# Patient Record
Sex: Female | Born: 1995
Health system: Southern US, Community
[De-identification: ages and names within clinical notes are randomized; demographics above are authoritative.]

## PROBLEM LIST (undated history)

## (undated) ENCOUNTER — Inpatient Hospital Stay (HOSPITAL_COMMUNITY): Payer: Self-pay

---
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
## 2015-09-04 ENCOUNTER — Encounter (HOSPITAL_COMMUNITY): Payer: Self-pay | Admitting: Nurse Practitioner

## 2015-09-04 ENCOUNTER — Emergency Department (HOSPITAL_COMMUNITY): Payer: Medicaid Other

## 2015-09-04 ENCOUNTER — Emergency Department (HOSPITAL_COMMUNITY)
Admission: EM | Admit: 2015-09-04 | Discharge: 2015-09-04 | Disposition: A | Discharge status: Home / Self Care | Payer: Medicaid Other | Attending: Emergency Medicine | Admitting: Emergency Medicine

## 2015-09-04 DIAGNOSIS — O210 Mild hyperemesis gravidarum: Secondary | ICD-10-CM | POA: Insufficient documentation

## 2015-09-04 DIAGNOSIS — Z3A10 10 weeks gestation of pregnancy: Secondary | ICD-10-CM | POA: Insufficient documentation

## 2015-09-04 DIAGNOSIS — Z792 Long term (current) use of antibiotics: Secondary | ICD-10-CM | POA: Diagnosis not present

## 2015-09-04 DIAGNOSIS — R1030 Lower abdominal pain, unspecified: Secondary | ICD-10-CM | POA: Insufficient documentation

## 2015-09-04 DIAGNOSIS — Z3490 Encounter for supervision of normal pregnancy, unspecified, unspecified trimester: Secondary | ICD-10-CM

## 2015-09-04 DIAGNOSIS — O9989 Other specified diseases and conditions complicating pregnancy, childbirth and the puerperium: Secondary | ICD-10-CM | POA: Diagnosis present

## 2015-09-04 LAB — URINALYSIS, ROUTINE W REFLEX MICROSCOPIC
Bilirubin Urine: NEGATIVE
GLUCOSE, UA: NEGATIVE mg/dL
Hgb urine dipstick: NEGATIVE
KETONES UR: 40 mg/dL — AB
LEUKOCYTES UA: NEGATIVE
NITRITE: NEGATIVE
PH: 7 (ref 5.0–8.0)
Protein, ur: NEGATIVE mg/dL
SPECIFIC GRAVITY, URINE: 1.023 (ref 1.005–1.030)

## 2015-09-04 LAB — COMPREHENSIVE METABOLIC PANEL
ALBUMIN: 4 g/dL (ref 3.5–5.0)
ALK PHOS: 45 U/L (ref 38–126)
ALT: 9 U/L — ABNORMAL LOW (ref 14–54)
ANION GAP: 10 (ref 5–15)
AST: 19 U/L (ref 15–41)
BILIRUBIN TOTAL: 0.5 mg/dL (ref 0.3–1.2)
BUN: 9 mg/dL (ref 6–20)
CALCIUM: 9.5 mg/dL (ref 8.9–10.3)
CO2: 24 mmol/L (ref 22–32)
Chloride: 101 mmol/L (ref 101–111)
Creatinine, Ser: 0.65 mg/dL (ref 0.44–1.00)
GFR calc non Af Amer: >60 mL/min (ref >60)
GLUCOSE: 86 mg/dL (ref 65–99)
POTASSIUM: 3.7 mmol/L (ref 3.5–5.1)
SODIUM: 135 mmol/L (ref 135–145)
TOTAL PROTEIN: 8.1 g/dL (ref 6.5–8.1)

## 2015-09-04 LAB — CBC
HEMATOCRIT: 39.8 % (ref 36.0–46.0)
HEMOGLOBIN: 13.3 g/dL (ref 12.0–15.0)
MCH: 29.8 pg (ref 26.0–34.0)
MCHC: 33.4 g/dL (ref 30.0–36.0)
MCV: 89.2 fL (ref 78.0–100.0)
Platelets: 259 10*3/uL (ref 150–400)
RBC: 4.46 MIL/uL (ref 3.87–5.11)
RDW: 13 % (ref 11.5–15.5)
WBC: 9.2 10*3/uL (ref 4.0–10.5)

## 2015-09-04 LAB — WET PREP, GENITAL
Clue Cells Wet Prep HPF POC: NONE SEEN
Sperm: NONE SEEN
Trich, Wet Prep: NONE SEEN
Yeast Wet Prep HPF POC: NONE SEEN

## 2015-09-04 LAB — I-STAT BETA HCG BLOOD, ED (MC, WL, AP ONLY): I-stat hCG, quantitative: >2000 m[IU]/mL — ABNORMAL HIGH (ref <5)

## 2015-09-04 LAB — HCG, QUANTITATIVE, PREGNANCY: HCG, BETA CHAIN, QUANT, S: 293145 m[IU]/mL — AB (ref <5)

## 2015-09-04 NOTE — Discharge Instructions (Signed)
First Trimester of Pregnancy The first trimester of pregnancy is from week 1 until the end of week 12 (months 1 through 3). A week after a sperm fertilizes an egg, the egg will implant on the wall of the uterus. This embryo will begin to develop into a baby. Genes from you and your partner are forming the baby. The female genes determine whether the baby is a boy or a girl. At 6-8 weeks, the eyes and face are formed, and the heartbeat can be seen on ultrasound. At the end of 12 weeks, all the baby's organs are formed.  Now that you are pregnant, you will want to do everything you can to have a healthy baby. Two of the most important things are to get good prenatal care and to follow your health care provider's instructions. Prenatal care is all the medical care you receive before the baby's birth. This care will help prevent, find, and treat any problems during the pregnancy and childbirth. BODY CHANGES Your body goes through many changes during pregnancy. The changes vary from woman to woman.   You may gain or lose a couple of pounds at first.  You may feel sick to your stomach (nauseous) and throw up (vomit). If the vomiting is uncontrollable, call your health care provider.  You may tire easily.  You may develop headaches that can be relieved by medicines approved by your health care provider.  You may urinate more often. Painful urination may mean you have a bladder infection.  You may develop heartburn as a result of your pregnancy.  You may develop constipation because certain hormones are causing the muscles that push waste through your intestines to slow down.  You may develop hemorrhoids or swollen, bulging veins (varicose veins).  Your breasts may begin to grow larger and become tender. Your nipples may stick out more, and the tissue that surrounds them (areola) may become darker.  Your gums may bleed and may be sensitive to brushing and flossing.  Dark spots or blotches (chloasma,  mask of pregnancy) may develop on your face. This will likely fade after the baby is born.  Your menstrual periods will stop.  You may have a loss of appetite.  You may develop cravings for certain kinds of food.  You may have changes in your emotions from day to day, such as being excited to be pregnant or being concerned that something may go wrong with the pregnancy and baby.  You may have more vivid and strange dreams.  You may have changes in your hair. These can include thickening of your hair, rapid growth, and changes in texture. Some women also have hair loss during or after pregnancy, or hair that feels dry or thin. Your hair will most likely return to normal after your baby is born. WHAT TO EXPECT AT YOUR PRENATAL VISITS During a routine prenatal visit:  You will be weighed to make sure you and the baby are growing normally.  Your blood pressure will be taken.  Your abdomen will be measured to track your baby's growth.  The fetal heartbeat will be listened to starting around week 10 or 12 of your pregnancy.  Test results from any previous visits will be discussed. Your health care provider may ask you:  How you are feeling.  If you are feeling the baby move.  If you have had any abnormal symptoms, such as leaking fluid, bleeding, severe headaches, or abdominal cramping.  If you are using any tobacco products,   including cigarettes, chewing tobacco, and electronic cigarettes.  If you have any questions. Other tests that may be performed during your first trimester include:  Blood tests to find your blood type and to check for the presence of any previous infections. They will also be used to check for low iron levels (anemia) and Rh antibodies. Later in the pregnancy, blood tests for diabetes will be done along with other tests if problems develop.  Urine tests to check for infections, diabetes, or protein in the urine.  An ultrasound to confirm the proper growth  and development of the baby.  An amniocentesis to check for possible genetic problems.  Fetal screens for spina bifida and Down syndrome.  You may need other tests to make sure you and the baby are doing well.  HIV (human immunodeficiency virus) testing. Routine prenatal testing includes screening for HIV, unless you choose not to have this test. HOME CARE INSTRUCTIONS  Medicines  Follow your health care provider's instructions regarding medicine use. Specific medicines may be either safe or unsafe to take during pregnancy.  Take your prenatal vitamins as directed.  If you develop constipation, try taking a stool softener if your health care provider approves. Diet  Eat regular, well-balanced meals. Choose a variety of foods, such as meat or vegetable-based protein, fish, milk and low-fat dairy products, vegetables, fruits, and whole grain breads and cereals. Your health care provider will help you determine the amount of weight gain that is right for you.  Avoid raw meat and uncooked cheese. These carry germs that can cause birth defects in the baby.  Eating four or five small meals rather than three large meals a day may help relieve nausea and vomiting. If you start to feel nauseous, eating a few soda crackers can be helpful. Drinking liquids between meals instead of during meals also seems to help nausea and vomiting.  If you develop constipation, eat more high-fiber foods, such as fresh vegetables or fruit and whole grains. Drink enough fluids to keep your urine clear or pale yellow. Activity and Exercise  Exercise only as directed by your health care provider. Exercising will help you:  Control your weight.  Stay in shape.  Be prepared for labor and delivery.  Experiencing pain or cramping in the lower abdomen or low back is a good sign that you should stop exercising. Check with your health care provider before continuing normal exercises.  Try to avoid standing for long  periods of time. Move your legs often if you must stand in one place for a long time.  Avoid heavy lifting.  Wear low-heeled shoes, and practice good posture.  You may continue to have sex unless your health care provider directs you otherwise. Relief of Pain or Discomfort  Wear a good support bra for breast tenderness.   Take warm sitz baths to soothe any pain or discomfort caused by hemorrhoids. Use hemorrhoid cream if your health care provider approves.   Rest with your legs elevated if you have leg cramps or low back pain.  If you develop varicose veins in your legs, wear support hose. Elevate your feet for 15 minutes, 3-4 times a day. Limit salt in your diet. Prenatal Care  Schedule your prenatal visits by the twelfth week of pregnancy. They are usually scheduled monthly at first, then more often in the last 2 months before delivery.  Write down your questions. Take them to your prenatal visits.  Keep all your prenatal visits as directed by your   health care provider. Safety  Wear your seat belt at all times when driving.  Make a list of emergency phone numbers, including numbers for family, friends, the hospital, and police and fire departments. General Tips  Ask your health care provider for a referral to a local prenatal education class. Begin classes no later than at the beginning of month 6 of your pregnancy.  Ask for help if you have counseling or nutritional needs during pregnancy. Your health care provider can offer advice or refer you to specialists for help with various needs.  Do not use hot tubs, steam rooms, or saunas.  Do not douche or use tampons or scented sanitary pads.  Do not cross your legs for long periods of time.  Avoid cat litter boxes and soil used by cats. These carry germs that can cause birth defects in the baby and possibly loss of the fetus by miscarriage or stillbirth.  Avoid all smoking, herbs, alcohol, and medicines not prescribed by  your health care provider. Chemicals in these affect the formation and growth of the baby.  Do not use any tobacco products, including cigarettes, chewing tobacco, and electronic cigarettes. If you need help quitting, ask your health care provider. You may receive counseling support and other resources to help you quit.  Schedule a dentist appointment. At home, brush your teeth with a soft toothbrush and be gentle when you floss. SEEK MEDICAL CARE IF:   You have dizziness.  You have mild pelvic cramps, pelvic pressure, or nagging pain in the abdominal area.  You have persistent nausea, vomiting, or diarrhea.  You have a bad smelling vaginal discharge.  You have pain with urination.  You notice increased swelling in your face, hands, legs, or ankles. SEEK IMMEDIATE MEDICAL CARE IF:   You have a fever.  You are leaking fluid from your vagina.  You have spotting or bleeding from your vagina.  You have severe abdominal cramping or pain.  You have rapid weight gain or loss.  You vomit blood or material that looks like coffee grounds.  You are exposed to German measles and have never had them.  You are exposed to fifth disease or chickenpox.  You develop a severe headache.  You have shortness of breath.  You have any kind of trauma, such as from a fall or a car accident.   This information is not intended to replace advice given to you by your health care provider. Make sure you discuss any questions you have with your health care provider.   Document Released: 06/01/2001 Document Revised: 06/28/2014 Document Reviewed: 04/17/2013 [employer] Interactive Patient Education 2016 [employer]  

## 2015-09-04 NOTE — ED Notes (Signed)
She c/o 2 week history of n/v, lower abd pain, headaches. She denies fevers, bowel/bladder changes

## 2015-09-04 NOTE — ED Provider Notes (Signed)
CSN: [id number]     Arrival date & time 09/04/15  1830 History  By signing my name below, I, Amy Lopez, attest that this documentation has been prepared under the direction and in the presence of [name], PA-C. Electronically Signed: Placido Lopez, ED Scribe. 09/04/2015. 9:09 PM.   Chief Complaint  Patient presents with  . Emesis   The history is provided by the patient. No language interpreter was used.    HPI Comments: Amy Lopez is a 20 y.o. female who is otherwise healthy presents to the Emergency Department complaining of intermittent, mild, lower abd pain onset ~2 months ago. She reports associated, intermittent, n/v. Her LNMP was in January and notes experiencing, intermittent, mild, spotting in February. She denies vaginal discharge, dysuria, vaginal bleeding or any other associated symptoms at this time.   History reviewed. No pertinent past medical history. History reviewed. No pertinent past surgical history. History reviewed. No pertinent family history. Social History  Substance Use Topics  . Smoking status: Never Smoker   . Smokeless tobacco: None  . Alcohol Use: Yes   OB History    No data available     Review of Systems  Gastrointestinal: Positive for nausea, vomiting and abdominal pain.  Genitourinary: Negative for dysuria, vaginal bleeding and vaginal discharge.    Allergies  Review of patient's allergies indicates no known allergies.  Home Medications   Prior to Admission medications   Medication Sig Start Date End Date Taking? Authorizing Provider  Alum & Mag Hydroxide-Simeth (MAGIC MOUTHWASH W/LIDOCAINE) SOLN Take 5 mLs by mouth 4 (four) times daily as needed. 12/31/12   Antony Madura, PA-C  amoxicillin (AMOXIL) 500 MG capsule Take 1 capsule (500 mg total) by mouth 2 (two) times daily. 12/31/12   Antony Madura, PA-C   BP 131/81 mmHg  Pulse 107  Temp(Src) 98.3 F (36.8 C) (Oral)  Resp 16  Ht  (1.626 m)  Wt 103 lb 12.8 oz (47.083 kg)   BMI 17.81 kg/m2  SpO2 100%  LMP 08/07/2015    Physical Exam  Constitutional: She is oriented to person, place, and time. She appears well-developed and well-nourished.  HENT:  Head: Normocephalic and atraumatic.  Eyes: Conjunctivae and EOM are normal. Pupils are equal, round, and reactive to light.  Neck: Normal range of motion. Neck supple.  Cardiovascular: Normal rate and regular rhythm.  Exam reveals no gallop and no friction rub.   No murmur heard. Pulmonary/Chest: Effort normal and breath sounds normal. No respiratory distress. She has no wheezes. She has no rales. She exhibits no tenderness.  Abdominal: Soft. Bowel sounds are normal. She exhibits no distension and no mass. There is no tenderness. There is no rebound and no guarding.  No focal abdominal tenderness, no RLQ tenderness or pain at McBurney's point, no RUQ tenderness or Murphy's sign, no left-sided abdominal tenderness, no fluid wave, or signs of peritonitis   Genitourinary:  Pelvic exam chaperoned by female ER tech, no right or left adnexal tenderness, no uterine tenderness, no vaginal discharge, no bleeding, no CMT or friability, no foreign body, no injury to the external genitalia, no other significant findings   Musculoskeletal: Normal range of motion. She exhibits no edema or tenderness.  Neurological: She is alert and oriented to person, place, and time.  Skin: Skin is warm and dry.  Psychiatric: She has a normal mood and affect. Her behavior is normal. Judgment and thought content normal.  Nursing note and vitals reviewed.    ED Course  Procedures  DIAGNOSTIC STUDIES: Oxygen Saturation is 100% on RA, normal by my interpretation.    COORDINATION OF CARE: 9:00 PM Discussed next steps with pt. She verbalized understanding and is agreeable with the plan.   Results for orders placed or performed during the hospital encounter of 09/04/15  Comprehensive metabolic panel  Result Value Ref Range   Sodium 135 135  - 145 mmol/L   Potassium 3.7 3.5 - 5.1 mmol/L   Chloride 101 101 - 111 mmol/L   CO2 24 22 - 32 mmol/L   Glucose, Bld 86 65 - 99 mg/dL   BUN 9 6 - 20 mg/dL   Creatinine, Ser 9.600.65 0.44 - 1.00 mg/dL   Calcium 9.5 8.9 - 45.410.3 mg/dL   Total Protein 8.1 6.5 - 8.1 g/dL   Albumin 4.0 3.5 - 5.0 g/dL   AST 19 15 - 41 U/L   ALT 9 (L) 14 - 54 U/L   Alkaline Phosphatase 45 38 - 126 U/L   Total Bilirubin 0.5 0.3 - 1.2 mg/dL   GFR calc non Af Amer >60 >60 mL/min   GFR calc Af Amer >60 >60 mL/min   Anion gap 10 5 - 15  CBC  Result Value Ref Range   WBC 9.2 4.0 - 10.5 K/uL   RBC 4.46 3.87 - 5.11 MIL/uL   Hemoglobin 13.3 12.0 - 15.0 g/dL   HCT 09.839.8 11.936.0 - 14.746.0 %   MCV 89.2 78.0 - 100.0 fL   MCH 29.8 26.0 - 34.0 pg   MCHC 33.4 30.0 - 36.0 g/dL   RDW 82.913.0 56.211.5 - 13.015.5 %   Platelets 259 150 - 400 K/uL  Urinalysis, Routine w reflex microscopic ([facility])  Result Value Ref Range   Color, Urine YELLOW YELLOW   APPearance CLEAR CLEAR   Specific Gravity, Urine 1.023 1.005 - 1.030   pH 7.0 5.0 - 8.0   Glucose, UA NEGATIVE NEGATIVE mg/dL   Hgb urine dipstick NEGATIVE NEGATIVE   Bilirubin Urine NEGATIVE NEGATIVE   Ketones, ur 40 (A) NEGATIVE mg/dL   Protein, ur NEGATIVE NEGATIVE mg/dL   Nitrite NEGATIVE NEGATIVE   Leukocytes, UA NEGATIVE NEGATIVE  I-Stat beta hCG blood, ED ([facility], [facility], [facility] only)  Result Value Ref Range   I-stat hCG, quantitative >2000.0 (H) <5 mIU/mL   Comment 3           [place] Ob Comp Less 14 Wks  09/04/2015  CLINICAL DATA:  Abdominal and pelvic pain. EXAM: OBSTETRIC <14 WK ULTRASOUND TECHNIQUE: Transabdominal ultrasound was performed for evaluation of the gestation as well as the maternal uterus and adnexal regions. COMPARISON:  None. FINDINGS: Intrauterine gestational sac: Visualized/normal in shape. Yolk sac:  Visualized Embryo:  Visualized Cardiac Activity: Visualized Heart Rate: 145 bpm MSD:   mm    w     d CRL:   32  mm   10 w 1 d                  US EDC: 03/31/2016 Subchorionic  hemorrhage:  None visualized. Maternal uterus/adnexae: Both maternal ovaries appear normal and there is no mass or free fluid identified in either adnexal region. IMPRESSION: Single live intrauterine pregnancy with estimated gestational age of [redacted] weeks and 1 day by crown-rump length measurement. Fetal heart rate of 145 beats per minute. Maternal ovaries appear grossly normal bilaterally and there is no mass or free fluid seen within either adnexal region. No free fluid in the cul-de-sac. Electronically Signed   By: Weyman CroonStan  Linde Gillis M.D.   On: 09/04/2015 21:49      I have personally reviewed and evaluated these images and lab results as part of my medical decision-making.    MDM   Final diagnoses:  Pregnancy  Morning sickness    Patient with lower abdominal pain and intermittent nausea and vomiting times several weeks. She has not had a period this month. Will check labs and hCG.  Patient does not have focal abdominal tenderness. Her i-STAT hCG is positive. Will check quantitative HCG as well as OB ultrasound given abdominal discomfort.  Pelvic exam and concerning, no bleeding.  Ultrasound consistent with a single live IUP, estimated gestational age of [redacted] weeks 1 day, normal fetal heart tones at 1 45 bpm. Ovaries are grossly normal bilaterally. Patient given follow-up instructions to establish prenatal care. She understands agrees with the plan. She is stable and ready for discharge.   I personally performed the services described in this documentation, which was scribed in my presence. The recorded information has been reviewed and is accurate.     [name], PA-C 09/04/15 2230  Linwood Dibbles, MD 09/05/15 1228

## 2015-09-04 NOTE — ED Notes (Signed)
Patient able to ambulate independently  

## 2015-09-05 LAB — GC/CHLAMYDIA PROBE AMP (~~LOC~~) NOT AT ARMC
CHLAMYDIA, DNA PROBE: NEGATIVE
Neisseria Gonorrhea: NEGATIVE

## 2015-10-02 ENCOUNTER — Encounter (HOSPITAL_COMMUNITY): Payer: Self-pay

## 2015-10-02 ENCOUNTER — Inpatient Hospital Stay (HOSPITAL_COMMUNITY)
Admission: AD | Admit: 2015-10-02 | Discharge: 2015-10-03 | Disposition: A | Discharge status: Home / Self Care | Payer: Medicaid Other | Source: Ambulatory Visit | Attending: Obstetrics & Gynecology | Admitting: Obstetrics & Gynecology

## 2015-10-02 DIAGNOSIS — O219 Vomiting of pregnancy, unspecified: Secondary | ICD-10-CM

## 2015-10-02 DIAGNOSIS — Z3A14 14 weeks gestation of pregnancy: Secondary | ICD-10-CM | POA: Diagnosis not present

## 2015-10-02 DIAGNOSIS — O26892 Other specified pregnancy related conditions, second trimester: Secondary | ICD-10-CM | POA: Diagnosis not present

## 2015-10-02 DIAGNOSIS — O26899 Other specified pregnancy related conditions, unspecified trimester: Secondary | ICD-10-CM

## 2015-10-02 DIAGNOSIS — R51 Headache: Secondary | ICD-10-CM | POA: Insufficient documentation

## 2015-10-02 DIAGNOSIS — R519 Headache, unspecified: Secondary | ICD-10-CM

## 2015-10-02 DIAGNOSIS — O210 Mild hyperemesis gravidarum: Secondary | ICD-10-CM | POA: Insufficient documentation

## 2015-10-02 DIAGNOSIS — R109 Unspecified abdominal pain: Secondary | ICD-10-CM

## 2015-10-02 NOTE — MAU Note (Signed)
Pt presents complaining of lower abdominal pain that comes and goes. Denies pain at this time. Also having headaches. Has not taken any pain medicine for it. Having nausea and vomiting. Thrown up 2 times today. Denies vaginal bleeding. Has a milky white discharge that is abnormal for her.

## 2015-10-03 ENCOUNTER — Encounter (HOSPITAL_COMMUNITY): Payer: Self-pay

## 2015-10-03 DIAGNOSIS — O219 Vomiting of pregnancy, unspecified: Secondary | ICD-10-CM

## 2015-10-03 LAB — URINALYSIS, ROUTINE W REFLEX MICROSCOPIC
Bilirubin Urine: NEGATIVE
GLUCOSE, UA: 100 mg/dL — AB
HGB URINE DIPSTICK: NEGATIVE
Ketones, ur: NEGATIVE mg/dL
LEUKOCYTES UA: NEGATIVE
Nitrite: NEGATIVE
PH: 6 (ref 5.0–8.0)
Protein, ur: NEGATIVE mg/dL
Specific Gravity, Urine: 1.02 (ref 1.005–1.030)

## 2015-10-03 MED ORDER — ACETAMINOPHEN 325 MG PO TABS
650.0000 mg | ORAL_TABLET | Freq: Once | ORAL | Status: AC
  Administered 2015-10-03: 650 mg via ORAL
  Filled 2015-10-03: qty 2

## 2015-10-03 MED ORDER — ONDANSETRON HCL 4 MG PO TABS
4.0000 mg | ORAL_TABLET | Freq: Four times a day (QID) | ORAL | Status: AC
Start: 2015-10-03 — End: ?

## 2015-10-03 MED ORDER — PRENATAL VITAMINS 28-0.8 MG PO TABS: 1.0000 | ORAL_TABLET | Freq: Every day | ORAL | Status: AC

## 2015-10-03 MED ORDER — ONDANSETRON HCL 4 MG PO TABS
8.0000 mg | ORAL_TABLET | Freq: Once | ORAL | Status: AC
  Administered 2015-10-03: 8 mg via ORAL
  Filled 2015-10-03: qty 2

## 2015-10-03 NOTE — MAU Provider Note (Signed)
Chief Complaint: Abdominal Pain and Emesis   First Provider Initiated Contact with Patient 10/03/15 0233      SUBJECTIVE HPI: Amy Lopez is a 20 y.o. G1P0 at 6434w2d by LMP who presents to maternity admissions reporting abdominal cramping, nausea/vomiting, and h/a.  She reports vomiting x 2 today. She is taking her sister's Zofran occasionally but has not taken any today.  She has not taken any medications for her h/a.   She denies vaginal bleeding, vaginal itching/burning, urinary symptoms, dizziness, n/v, or fever/chills.     Abdominal Pain This is a new problem. The current episode started in the past 7 days. The onset quality is gradual. The problem occurs intermittently. The problem has been unchanged. The pain is located in the generalized abdominal region. The pain is moderate. The quality of the pain is cramping. Associated symptoms include headaches, nausea and vomiting. Pertinent negatives include no constipation, dysuria or fever. Nothing aggravates the pain. The pain is relieved by nothing. She has tried nothing for the symptoms.  Emesis  This is a recurrent problem. The current episode started 1 to 4 weeks ago. The problem occurs 2 to 4 times per day. The problem has been waxing and waning. The emesis has an appearance of stomach contents. There has been no fever. Associated symptoms include abdominal pain and headaches. Pertinent negatives include no chest pain, chills, dizziness or fever. Treatments tried: Zofran. The treatment provided mild relief.  Headache  This is a new problem. The current episode started yesterday. The problem occurs intermittently. The problem has been waxing and waning. The pain is located in the frontal and bilateral region. The pain does not radiate. The pain quality is similar to prior headaches. The quality of the pain is described as dull. Associated symptoms include abdominal pain, nausea and vomiting. Pertinent negatives include no dizziness or fever.  Nothing aggravates the symptoms. She has tried nothing for the symptoms.    History reviewed. No pertinent past medical history. History reviewed. No pertinent past surgical history. Social History   Social History  . Marital Status: Single    Spouse Name: N/A  . Number of Children: N/A  . Years of Education: N/A   Occupational History  . Not on file.   Social History Main Topics  . Smoking status: Never Smoker   . Smokeless tobacco: Not on file  . Alcohol Use: Yes  . Drug Use: No  . Sexual Activity: Yes    Birth Control/ Protection: None   Other Topics Concern  . Not on file   Social History Narrative   No current facility-administered medications on file prior to encounter.   Current Outpatient Prescriptions on File Prior to Encounter  Medication Sig Dispense Refill  . Alum & Mag Hydroxide-Simeth (MAGIC MOUTHWASH W/LIDOCAINE) SOLN Take 5 mLs by mouth 4 (four) times daily as needed. 120 mL 0  . amoxicillin (AMOXIL) 500 MG capsule Take 1 capsule (500 mg total) by mouth 2 (two) times daily. 20 capsule 0   No Known Allergies  ROS:  Review of Systems  Constitutional: Negative for fever, chills and fatigue.  Respiratory: Negative for shortness of breath.   Cardiovascular: Negative for chest pain.  Gastrointestinal: Positive for nausea, vomiting and abdominal pain. Negative for constipation.  Genitourinary: Negative for dysuria, flank pain, vaginal bleeding, vaginal discharge, difficulty urinating, vaginal pain and pelvic pain.  Neurological: Positive for headaches. Negative for dizziness.  Psychiatric/Behavioral: Negative.      I have reviewed patient's Past Medical Hx, Surgical  Hx, Family Hx, Social Hx, medications and allergies.   Physical Exam   Patient Vitals for the past 24 hrs:  BP Temp Temp src Pulse Resp  10/03/15 0305 109/65 mmHg 98 F (36.7 C) Oral 75 16  10/03/15 0000 134/67 mmHg 98.3 F (36.8 C) Oral 106 18   Constitutional: Well-developed,  well-nourished female in no acute distress.  Cardiovascular: normal rate Respiratory: normal effort GI: Abd soft, non-tender. Pos BS x 4 MS: Extremities nontender, no edema, normal ROM Neurologic: Alert and oriented x 4.  GU: Neg CVAT.  PELVIC EXAM: Cervix pink, visually closed, without lesion, scant white creamy discharge, vaginal walls and external genitalia normal Bimanual exam: Cervix 0/long/high, firm, anterior, neg CMT, uterus nontender, nonenlarged, adnexa without tenderness, enlargement, or mass  FHT 142 by doppler  LAB RESULTS Results for orders placed or performed during the hospital encounter of 10/02/15 (from the past 24 hour(s))  Urinalysis, Routine w reflex microscopic (not at [facility])     Status: Abnormal   Collection Time: 10/03/15 12:16 AM  Result Value Ref Range   Color, Urine YELLOW YELLOW   APPearance CLEAR CLEAR   Specific Gravity, Urine 1.020 1.005 - 1.030   pH 6.0 5.0 - 8.0   Glucose, UA 100 (A) NEGATIVE mg/dL   Hgb urine dipstick NEGATIVE NEGATIVE   Bilirubin Urine NEGATIVE NEGATIVE   Ketones, ur NEGATIVE NEGATIVE mg/dL   Protein, ur NEGATIVE NEGATIVE mg/dL   Nitrite NEGATIVE NEGATIVE   Leukocytes, UA NEGATIVE NEGATIVE       IMAGING US Ob Comp Less 14 Wks  09/04/2015  CLINICAL DATA:  Abdominal and pelvic pain. EXAM: OBSTETRIC <14 WK ULTRASOUND TECHNIQUE: Transabdominal ultrasound was performed for evaluation of the gestation as well as the maternal uterus and adnexal regions. COMPARISON:  None. FINDINGS: Intrauterine gestational sac: Visualized/normal in shape. Yolk sac:  Visualized Embryo:  Visualized Cardiac Activity: Visualized Heart Rate: 145 bpm MSD:   mm    w     d CRL:   32  mm   10 w 1 d                  [place] EDC: 03/31/2016 Subchorionic hemorrhage:  None visualized. Maternal uterus/adnexae: Both maternal ovaries appear normal and there is no mass or free fluid identified in either adnexal region. IMPRESSION: Single live intrauterine pregnancy with  estimated gestational age of [redacted] weeks and 1 day by crown-rump length measurement. Fetal heart rate of 145 beats per minute. Maternal ovaries appear grossly normal bilaterally and there is no mass or free fluid seen within either adnexal region. No free fluid in the cul-de-sac. Electronically Signed   By: Bary Richard M.D.   On: 09/04/2015 21:49    MAU Management/MDM: Ordered labs and reviewed results.  No evidence of dehydration on UA.  FHT wnl.  Will treat n/v with PO Zofran 8 mg and h/a with Tylenol 650 mg PO in MAU.   Pt denies pain and nausea after medications.  Recommend increase PO fluids and keep prenatal appointment at [facility] in May.  Pt stable at time of discharge.  ASSESSMENT 1. Nausea and vomiting during pregnancy prior to [redacted] weeks gestation   2. Headache in pregnancy, antepartum, second trimester   3. Abdominal pain affecting pregnancy, antepartum     PLAN Discharge home Rx for PNV on pt request    Medication List    STOP taking these medications        amoxicillin 500 MG capsule  Commonly known  as:  AMOXIL     magic mouthwash w/lidocaine Soln      TAKE these medications        ondansetron 4 MG tablet  Commonly known as:  ZOFRAN  Take 1 tablet (4 mg total) by mouth every 6 (six) hours.     Prenatal Vitamins 28-0.8 MG Tabs  Take 1 tablet by mouth daily.       Follow-up Information    Follow up with [facility].   Why:  As scheduled, Return to MAU as needed for emergencies   Contact information:   [address] [place] [address] [phone]       Sharen Counter Certified Nurse-Midwife 10/03/2015  3:12 AM

## 2015-10-03 NOTE — Discharge Instructions (Signed)
Morning Sickness Morning sickness is when you feel sick to your stomach (nauseous) during pregnancy. This nauseous feeling may or may not come with vomiting. It often occurs in the morning but can be a problem any time of day. Morning sickness is most common during the first trimester, but it may continue throughout pregnancy. While morning sickness is unpleasant, it is usually harmless unless you develop severe and continual vomiting (hyperemesis gravidarum). This condition requires more intense treatment.  CAUSES  The cause of morning sickness is not completely known but seems to be related to normal hormonal changes that occur in pregnancy. RISK FACTORS You are at greater risk if you:  Experienced nausea or vomiting before your pregnancy.  Had morning sickness during a previous pregnancy.  Are pregnant with more than one baby, such as twins. TREATMENT  Do not use any medicines (prescription, over-the-counter, or herbal) for morning sickness without first talking to your health care provider. Your health care provider may prescribe or recommend:  Vitamin B6 supplements.  Anti-nausea medicines.  The herbal medicine ginger. HOME CARE INSTRUCTIONS   Only take over-the-counter or prescription medicines as directed by your health care provider.  Taking multivitamins before getting pregnant can prevent or decrease the severity of morning sickness in most women.  Eat a piece of dry toast or unsalted crackers before getting out of bed in the morning.  Eat five or six small meals a day.  Eat dry and bland foods (rice, baked potato). Foods high in carbohydrates are often helpful.  Do not drink liquids with your meals. Drink liquids between meals.  Avoid greasy, fatty, and spicy foods.  Get someone to cook for you if the smell of any food causes nausea and vomiting.  If you feel nauseous after taking prenatal vitamins, take the vitamins at night or with a snack.  Snack on protein  foods (nuts, yogurt, cheese) between meals if you are hungry.  Eat unsweetened gelatins for desserts.  Wearing an acupressure wristband (worn for sea sickness) may be helpful.  Acupuncture may be helpful.  Do not smoke.  Get a humidifier to keep the air in your house free of odors.  Get plenty of fresh air. SEEK MEDICAL CARE IF:   Your home remedies are not working, and you need medicine.  You feel dizzy or lightheaded.  You are losing weight. SEEK IMMEDIATE MEDICAL CARE IF:   You have persistent and uncontrolled nausea and vomiting.  You pass out (faint). MAKE SURE YOU:  Understand these instructions.  Will watch your condition.  Will get help right away if you are not doing well or get worse.   This information is not intended to replace advice given to you by your health care provider. Make sure you discuss any questions you have with your health care provider.   Document Released: 07/29/2006 Document Revised: 06/12/2013 Document Reviewed: 11/22/2012 Elsevier Interactive Patient Education 2016 [employer]  Abdominal Pain During Pregnancy Abdominal pain is common in pregnancy. Most of the time, it does not cause harm. There are many causes of abdominal pain. Some causes are more serious than others. Some of the causes of abdominal pain in pregnancy are easily diagnosed. Occasionally, the diagnosis takes time to understand. Other times, the cause is not determined. Abdominal pain can be a sign that something is very wrong with the pregnancy, or the pain may have nothing to do with the pregnancy at all. For this reason, always tell your health care provider if you have any  abdominal discomfort. HOME CARE INSTRUCTIONS  Monitor your abdominal pain for any changes. The following actions may help to alleviate any discomfort you are experiencing:  Do not have sexual intercourse or put anything in your vagina until your symptoms go away completely.  Get plenty of rest until  your pain improves.  Drink clear fluids if you feel nauseous. Avoid solid food as long as you are uncomfortable or nauseous.  Only take over-the-counter or prescription medicine as directed by your health care provider.  Keep all follow-up appointments with your health care provider. SEEK IMMEDIATE MEDICAL CARE IF:  You are bleeding, leaking fluid, or passing tissue from the vagina.  You have increasing pain or cramping.  You have persistent vomiting.  You have painful or bloody urination.  You have a fever.  You notice a decrease in your baby's movements.  You have extreme weakness or feel faint.  You have shortness of breath, with or without abdominal pain.  You develop a severe headache with abdominal pain.  You have abnormal vaginal discharge with abdominal pain.  You have persistent diarrhea.  You have abdominal pain that continues even after rest, or gets worse. MAKE SURE YOU:   Understand these instructions.  Will watch your condition.  Will get help right away if you are not doing well or get worse.   This information is not intended to replace advice given to you by your health care provider. Make sure you discuss any questions you have with your health care provider.   Document Released: 06/07/2005 Document Revised: 03/28/2013 Document Reviewed: 01/04/2013 Elsevier Interactive Patient Education 2016 [employer] Inc[employer]  General Headache Without Cause A headache is pain or discomfort felt around the head or neck area. The specific cause of a headache may not be found. There are many causes and types of headaches. A few common ones are:  Tension headaches.  Migraine headaches.  Cluster headaches.  Chronic daily headaches. HOME CARE INSTRUCTIONS  Watch your condition for any changes. Take these steps to help with your condition: Managing Pain  Take over-the-counter and prescription medicines only as told by your health care provider.  Lie down in a dark,  quiet room when you have a headache.  If directed, apply ice to the head and neck area:  Put ice in a plastic bag.  Place a towel between your skin and the bag.  Leave the ice on for 20 minutes, 2-3 times per day.  Use a heating pad or hot shower to apply heat to the head and neck area as told by your health care provider.  Keep lights dim if bright lights bother you or make your headaches worse. Eating and Drinking  Eat meals on a regular schedule.  Limit alcohol use.  Decrease the amount of caffeine you drink, or stop drinking caffeine. General Instructions  Keep all follow-up visits as told by your health care provider. This is important.  Keep a headache journal to help find out what may trigger your headaches. For example, write down:  What you eat and drink.  How much sleep you get.  Any change to your diet or medicines.  Try massage or other relaxation techniques.  Limit stress.  Sit up straight, and do not tense your muscles.  Do not use tobacco products, including cigarettes, chewing tobacco, or e-cigarettes. If you need help quitting, ask your health care provider.  Exercise regularly as told by your health care provider.  Sleep on a regular schedule. Get 7-9 hours  of sleep, or the amount recommended by your health care provider. SEEK MEDICAL CARE IF:   Your symptoms are not helped by medicine.  You have a headache that is different from the usual headache.  You have nausea or you vomit.  You have a fever. SEEK IMMEDIATE MEDICAL CARE IF:   Your headache becomes severe.  You have repeated vomiting.  You have a stiff neck.  You have a loss of vision.  You have problems with speech.  You have pain in the eye or ear.  You have muscular weakness or loss of muscle control.  You lose your balance or have trouble walking.  You feel faint or pass out.  You have confusion.   This information is not intended to replace advice given to you by  your health care provider. Make sure you discuss any questions you have with your health care provider.   Document Released: 06/07/2005 Document Revised: 02/26/2015 Document Reviewed: 09/30/2014 Elsevier Interactive Patient Education [employer]

## 2016-08-06 ENCOUNTER — Encounter (HOSPITAL_COMMUNITY): Payer: Self-pay

## 2017-01-02 IMAGING — US US OB COMP LESS 14 WK
1 series · 14 of 27 positions shown · non-contrast
Comparison: None.

CLINICAL DATA: Abdominal and pelvic pain.

EXAM:
OBSTETRIC <14 WK ULTRASOUND
TECHNIQUE: Transabdominal ultrasound was performed for evaluation of the
gestation as well as the maternal uterus and adnexal regions.

[Series 1: us ob comp less 14 wk · 0.23mm/px · 27 acquisitions, 14 frames shown]
[im 1/27]
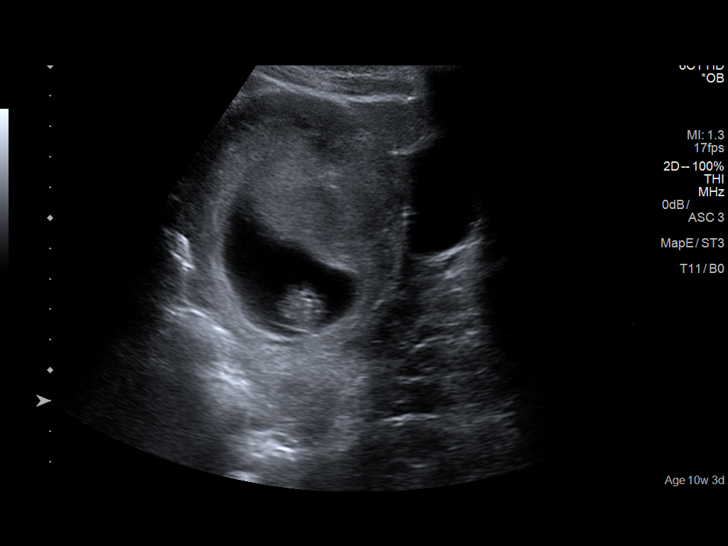
[im 3/27]
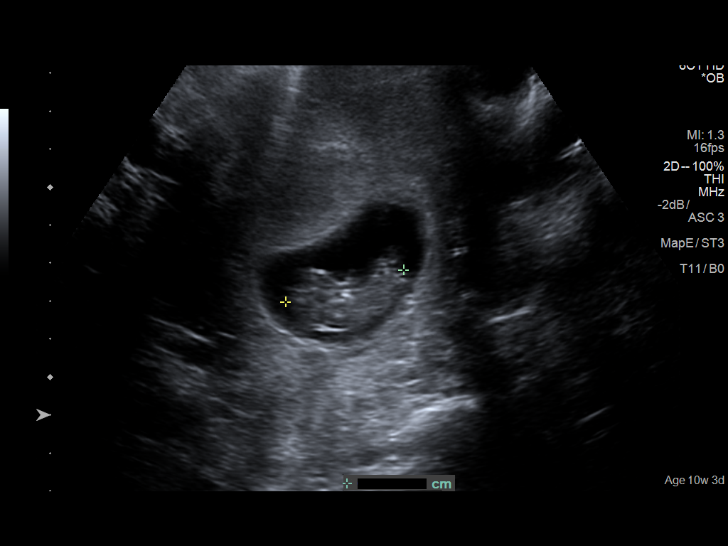
[im 5/27]
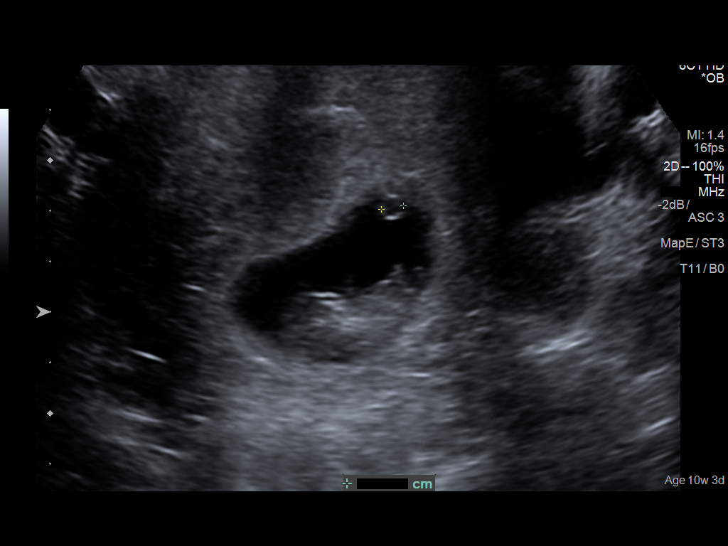
[im 7/27]
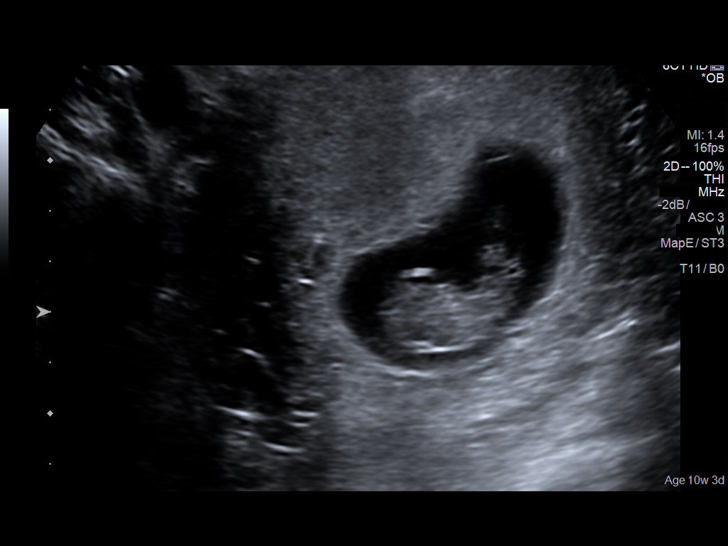
[im 9/27]
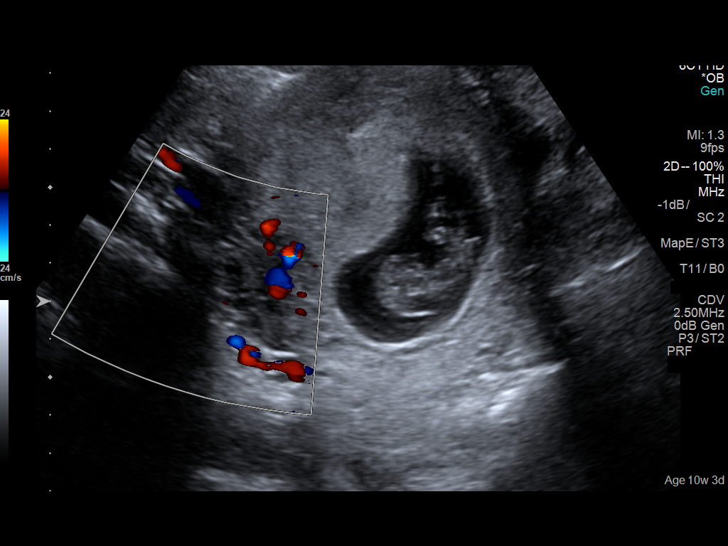
[im 11/27]
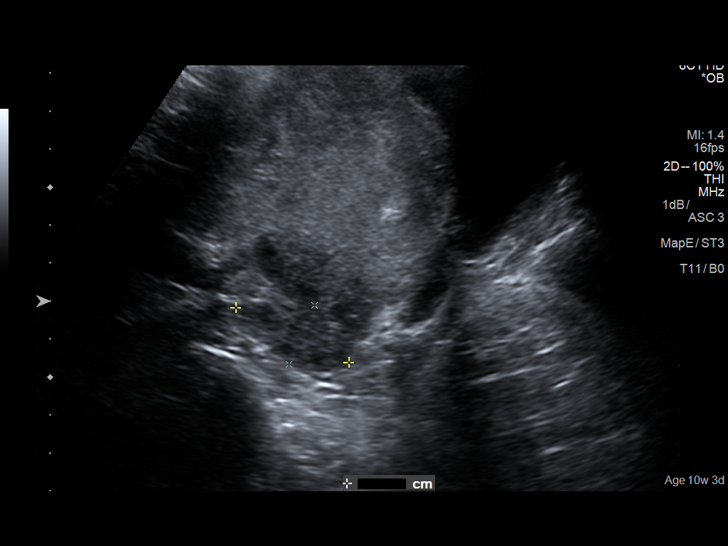
[im 13/27]
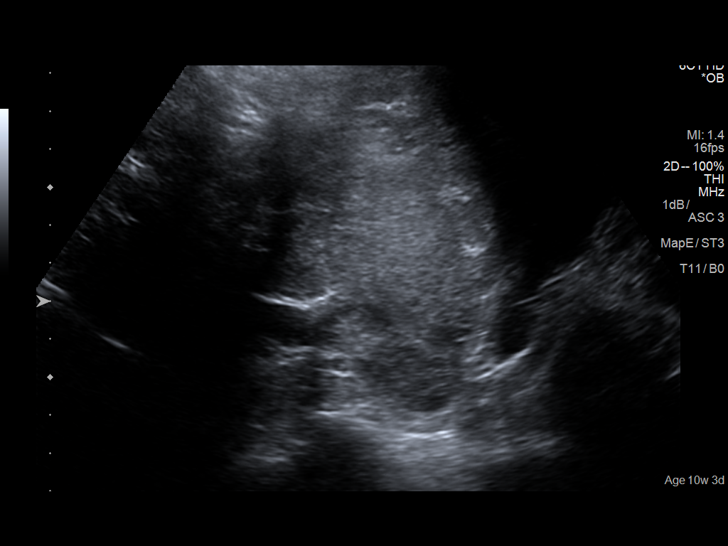
[im 15/27]
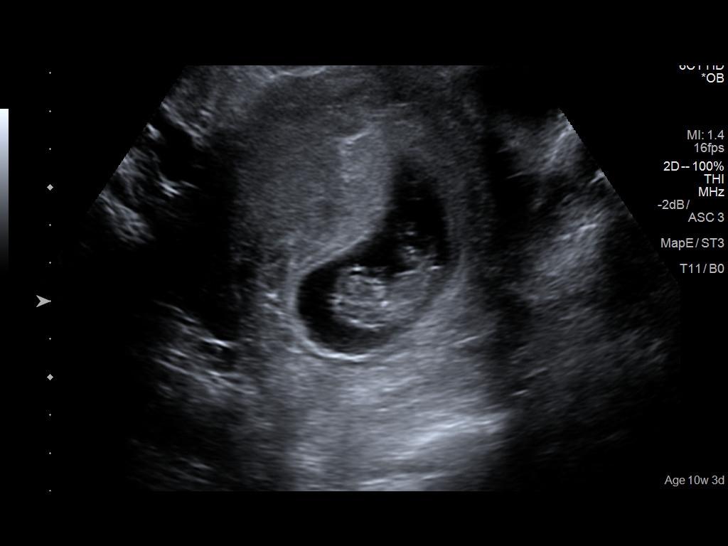
[im 17/27]
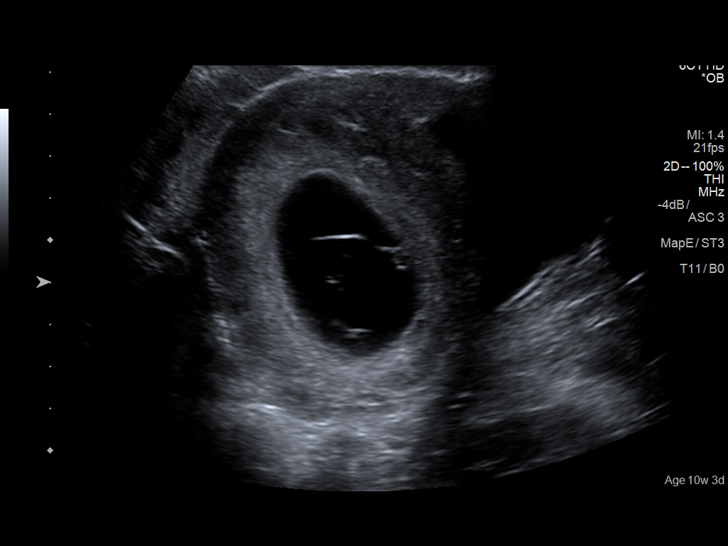
[im 19/27]
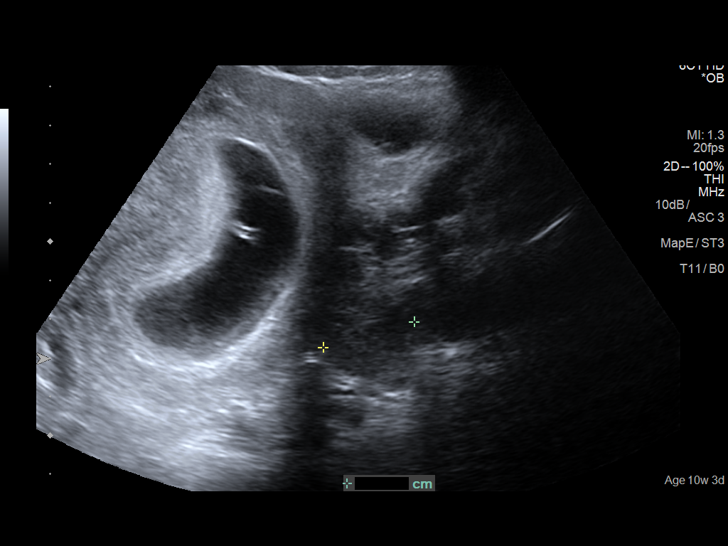
[im 21/27]
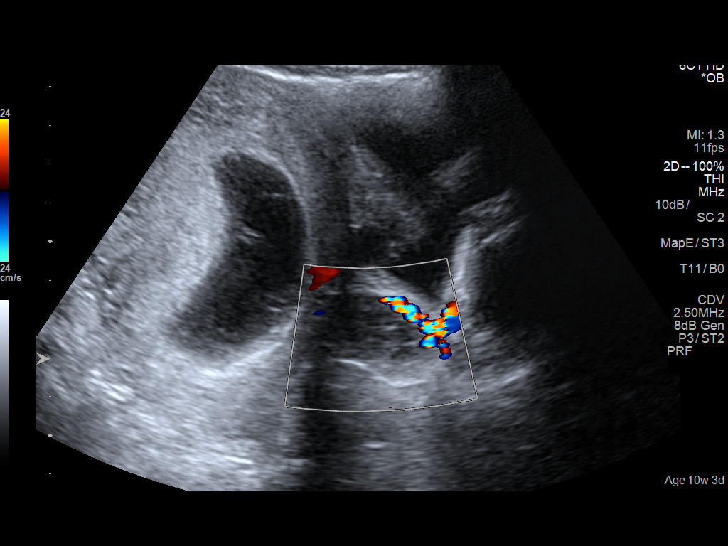
[im 23/27]
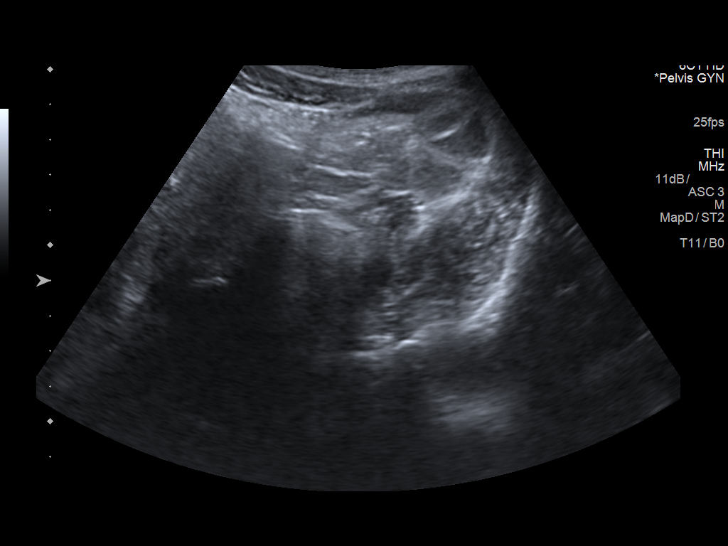
[im 25/27]
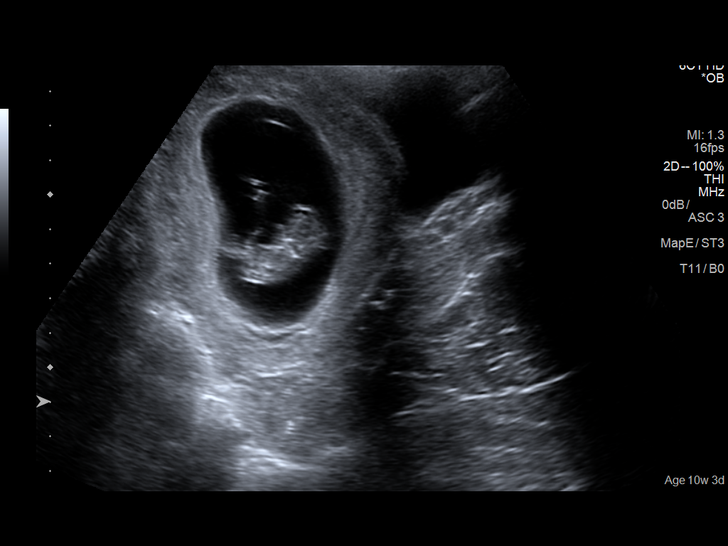
[im 27/27]
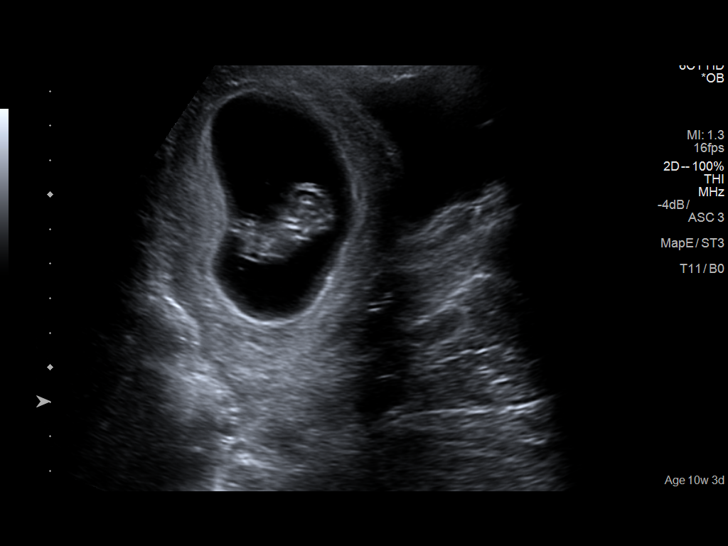

[14 of 27 positions shown; findings below may reference images not displayed]

FINDINGS: Intrauterine gestational sac: Visualized/normal in shape.

Yolk sac:  Visualized

Embryo:  Visualized

Cardiac Activity: Visualized

Heart Rate: 145 bpm

MSD:   mm    w     d

CRL:   32  mm   10 w 1 d                  US EDC: 03/31/2016

Subchorionic hemorrhage:  None visualized.

Maternal uterus/adnexae: Both maternal ovaries appear normal and
there is no mass or free fluid identified in either adnexal region.
IMPRESSION: Single live intrauterine pregnancy with estimated gestational age of
10 weeks and 1 day by crown-rump length measurement. Fetal heart
rate of 145 beats per minute.

Maternal ovaries appear grossly normal bilaterally and there is no
mass or free fluid seen within either adnexal region. No free fluid
in the cul-de-sac.

## 2021-05-26 ENCOUNTER — Ambulatory Visit: Payer: Self-pay | Admitting: Nurse Practitioner

## 2024-09-27 ENCOUNTER — Encounter: Admitting: Obstetrics and Gynecology
